# Patient Record
Sex: Male | Born: 1965 | Race: White | Hispanic: No | Marital: Married | Smoking: Former smoker
Health system: Southern US, Community
[De-identification: ages and names within clinical notes are randomized; demographics above are authoritative.]

## PROBLEM LIST (undated history)

## (undated) DIAGNOSIS — C4491 Basal cell carcinoma of skin, unspecified: Secondary | ICD-10-CM

## (undated) DIAGNOSIS — J309 Allergic rhinitis, unspecified: Secondary | ICD-10-CM

## (undated) DIAGNOSIS — S022XXA Fracture of nasal bones, initial encounter for closed fracture: Secondary | ICD-10-CM

## (undated) HISTORY — DX: Allergic rhinitis, unspecified: J30.9

## (undated) HISTORY — DX: Fracture of nasal bones, initial encounter for closed fracture: S02.2XXA

## (undated) HISTORY — PX: KNEE SURGERY: SHX244

## (undated) HISTORY — DX: Basal cell carcinoma of skin, unspecified: C44.91

## (undated) HISTORY — PX: VASECTOMY: SHX75

---

## 2003-07-12 ENCOUNTER — Encounter: Admission: RE | Admit: 2003-07-12 | Discharge: 2003-07-12 | Payer: Self-pay | Admitting: Internal Medicine

## 2003-07-12 ENCOUNTER — Encounter: Payer: Self-pay | Admitting: Internal Medicine

## 2005-09-08 ENCOUNTER — Ambulatory Visit: Payer: Self-pay | Admitting: Internal Medicine

## 2005-10-14 ENCOUNTER — Ambulatory Visit: Payer: Self-pay | Admitting: Internal Medicine

## 2006-02-03 ENCOUNTER — Ambulatory Visit: Payer: Self-pay | Admitting: Internal Medicine

## 2006-03-22 ENCOUNTER — Ambulatory Visit: Payer: Self-pay | Admitting: Internal Medicine

## 2006-07-07 ENCOUNTER — Ambulatory Visit: Payer: Self-pay | Admitting: Internal Medicine

## 2007-02-15 DIAGNOSIS — E8881 Metabolic syndrome: Secondary | ICD-10-CM | POA: Insufficient documentation

## 2007-02-17 ENCOUNTER — Ambulatory Visit: Payer: Self-pay | Admitting: Internal Medicine

## 2007-02-17 LAB — CONVERTED CEMR LAB
ALT: 44 units/L — ABNORMAL HIGH (ref 0–40)
AST: 28 units/L (ref 0–37)
BUN: 14 mg/dL (ref 6–23)
Cholesterol: 214 mg/dL (ref 0–200)
Creatinine, Ser: 0.9 mg/dL (ref 0.4–1.5)
Creatinine,U: 180.6 mg/dL
Direct LDL: 65.9 mg/dL
HDL: 24 mg/dL — ABNORMAL LOW (ref >39.0)
Hgb A1c MFr Bld: 4.9 % (ref 4.6–6.0)
Microalb Creat Ratio: 2.2 mg/g (ref 0.0–30.0)
Microalb, Ur: 0.4 mg/dL (ref 0.0–1.9)
Potassium: 4.1 meq/L (ref 3.5–5.1)
Total CHOL/HDL Ratio: 8.9
Triglycerides: 611 mg/dL (ref 0–149)
VLDL: 122 mg/dL — ABNORMAL HIGH (ref 0–40)

## 2007-05-26 ENCOUNTER — Ambulatory Visit: Payer: Self-pay | Admitting: Internal Medicine

## 2007-05-30 LAB — CONVERTED CEMR LAB
ALT: 22 units/L (ref 0–53)
AST: 20 units/L (ref 0–37)
BUN: 16 mg/dL (ref 6–23)
Cholesterol: 192 mg/dL (ref 0–200)
Creatinine, Ser: 1.1 mg/dL (ref 0.4–1.5)
Creatinine,U: 174.3 mg/dL
Direct LDL: 125.9 mg/dL
HDL: 27.3 mg/dL — ABNORMAL LOW (ref >39.0)
Hgb A1c MFr Bld: 5.1 % (ref 4.6–6.0)
Microalb Creat Ratio: 3.4 mg/g (ref 0.0–30.0)
Microalb, Ur: 0.6 mg/dL (ref 0.0–1.9)
Potassium: 4.4 meq/L (ref 3.5–5.1)
Total CHOL/HDL Ratio: 7
Triglycerides: 211 mg/dL (ref 0–149)
VLDL: 42 mg/dL — ABNORMAL HIGH (ref 0–40)

## 2007-05-31 ENCOUNTER — Encounter (INDEPENDENT_AMBULATORY_CARE_PROVIDER_SITE_OTHER): Payer: Self-pay | Admitting: *Deleted

## 2007-07-18 ENCOUNTER — Ambulatory Visit: Payer: Self-pay | Admitting: Internal Medicine

## 2007-07-18 LAB — CONVERTED CEMR LAB
Cholesterol, target level: 200 mg/dL
HDL goal, serum: 40 mg/dL
LDL Goal: 160 mg/dL

## 2008-01-17 ENCOUNTER — Ambulatory Visit: Payer: Self-pay | Admitting: Internal Medicine

## 2008-01-19 ENCOUNTER — Encounter (INDEPENDENT_AMBULATORY_CARE_PROVIDER_SITE_OTHER): Payer: Self-pay | Admitting: *Deleted

## 2008-01-24 ENCOUNTER — Encounter: Payer: Self-pay | Admitting: Internal Medicine

## 2008-01-30 LAB — CONVERTED CEMR LAB
Cholesterol: 175 mg/dL (ref 0–200)
HDL: 30 mg/dL — ABNORMAL LOW (ref >39.0)
LDL Cholesterol: 111 mg/dL — ABNORMAL HIGH (ref 0–99)
Total CHOL/HDL Ratio: 5.8
Triglycerides: 171 mg/dL — ABNORMAL HIGH (ref 0–149)
VLDL: 34 mg/dL (ref 0–40)

## 2008-02-02 ENCOUNTER — Ambulatory Visit: Payer: Self-pay | Admitting: Internal Medicine

## 2008-02-02 DIAGNOSIS — E785 Hyperlipidemia, unspecified: Secondary | ICD-10-CM | POA: Insufficient documentation

## 2008-03-16 ENCOUNTER — Emergency Department (HOSPITAL_BASED_OUTPATIENT_CLINIC_OR_DEPARTMENT_OTHER): Admission: EM | Admit: 2008-03-16 | Discharge: 2008-03-16 | Payer: Self-pay | Admitting: Emergency Medicine

## 2008-05-04 ENCOUNTER — Ambulatory Visit: Payer: Self-pay | Admitting: Internal Medicine

## 2008-05-08 ENCOUNTER — Encounter: Payer: Self-pay | Admitting: Internal Medicine

## 2008-05-21 ENCOUNTER — Ambulatory Visit: Payer: Self-pay | Admitting: Internal Medicine

## 2008-10-19 ENCOUNTER — Ambulatory Visit: Payer: Self-pay | Admitting: Family Medicine

## 2008-10-26 ENCOUNTER — Ambulatory Visit: Payer: Self-pay | Admitting: Family Medicine

## 2008-10-26 ENCOUNTER — Telehealth (INDEPENDENT_AMBULATORY_CARE_PROVIDER_SITE_OTHER): Payer: Self-pay | Admitting: *Deleted

## 2008-11-14 ENCOUNTER — Encounter: Payer: Self-pay | Admitting: Internal Medicine

## 2009-04-05 ENCOUNTER — Telehealth (INDEPENDENT_AMBULATORY_CARE_PROVIDER_SITE_OTHER): Payer: Self-pay | Admitting: *Deleted

## 2010-01-13 ENCOUNTER — Encounter (INDEPENDENT_AMBULATORY_CARE_PROVIDER_SITE_OTHER): Payer: Self-pay | Admitting: *Deleted

## 2010-02-17 ENCOUNTER — Telehealth (INDEPENDENT_AMBULATORY_CARE_PROVIDER_SITE_OTHER): Payer: Self-pay | Admitting: *Deleted

## 2010-02-21 ENCOUNTER — Ambulatory Visit: Payer: Self-pay | Admitting: Internal Medicine

## 2010-02-21 DIAGNOSIS — Z85828 Personal history of other malignant neoplasm of skin: Secondary | ICD-10-CM | POA: Insufficient documentation

## 2010-03-02 LAB — CONVERTED CEMR LAB
ALT: 45 units/L (ref 0–53)
AST: 27 units/L (ref 0–37)
Albumin: 4.5 g/dL (ref 3.5–5.2)
Alkaline Phosphatase: 65 units/L (ref 39–117)
BUN: 18 mg/dL (ref 6–23)
Bilirubin, Direct: 0.1 mg/dL (ref 0.0–0.3)
Cholesterol: 231 mg/dL — ABNORMAL HIGH (ref 0–200)
Creatinine, Ser: 0.9 mg/dL (ref 0.4–1.5)
HDL: 34.4 mg/dL — ABNORMAL LOW (ref >39.00)
Hgb A1c MFr Bld: 5.2 % (ref 4.6–6.5)
Potassium: 4.6 meq/L (ref 3.5–5.1)
Total Bilirubin: 0.4 mg/dL (ref 0.3–1.2)
Total CHOL/HDL Ratio: 7
Total Protein: 7.5 g/dL (ref 6.0–8.3)
Triglycerides: 459 mg/dL — ABNORMAL HIGH (ref 0.0–149.0)
VLDL: 91.8 mg/dL — ABNORMAL HIGH (ref 0.0–40.0)

## 2010-10-28 NOTE — Letter (Signed)
Summary: Primary Care Appointment Letter  [facility]  [address] [place], [place] [address]   Phone: [phone]  Fax: [phone]    01/13/2010 MRN: [id number]  JONATHON TAN [address] [place], [place]  [address]  Dear Mr. QUEZADA,   Your Primary Care Physician Marga Melnick MD has indicated that:    ___X____it is time to schedule an appointment.  Please call our office @ [phone] to schedule an office visit with Dr. Alwyn Ren.    Thank you,    [facility] Scheduler

## 2010-10-28 NOTE — Assessment & Plan Note (Signed)
Summary: rto & lab/cbs   Vital Signs:  Patient profile:   45 year old male Weight:      297.6 pounds Temp:     98.4 degrees F oral Pulse rate:   72 / minute Resp:     16 per minute BP sitting:   128 / 80  (left arm) Cuff size:   large  Vitals Entered By: Shonna Chock (Feb 21, 2010 9:29 AM) CC: Follow-up visit: Fasting if labs needed, not seen in a while and would like to follow-up, Lipid Management Comments REVIEWED MED LIST, PATIENT AGREED DOSE AND INSTRUCTION CORRECT    CC:  Follow-up visit: Fasting if labs needed, not seen in a while and would like to follow-up, and Lipid Management.  History of Present Illness: He never took Crestor; he ran out of Trilipix in 08/2009. NMR reviewed & risks discussed. No specific diet; no CVE on regular basis. Role of High Fructose Corn Syrup raising TG, causing central obesity   & being pre Diabetic risk discussed.Sister has IDDM.  Lipid Management History:      Positive NCEP/ATP III risk factors include HDL cholesterol less than 40.  Negative NCEP/ATP III risk factors include male age less than 72 years old, non-diabetic, no family history for ischemic heart disease, non-tobacco-user status, non-hypertensive, no ASHD (atherosclerotic heart disease), no prior stroke/TIA, no peripheral vascular disease, and no history of aortic aneurysm.     Allergies (verified): No Known Drug Allergies  Past History:  Past Medical History: HYPERLIPIDEMIA (ICD-272.2):NMR 2009: LDL 141(2850/2194), HDL 43, TG 287. [place] LDL goal = < 160. METABOLIC SYNDROME X (ICD-277.7)  Skin cancer, hx of, Basal Cell, Squamous Cell  Review of Systems CV:  Complains of shortness of breath with exertion; denies chest pain or discomfort, leg cramps with exertion, swelling of feet, and swelling of hands. Derm:  Denies poor wound healing. Neuro:  Denies numbness and tingling. Endo:  Denies excessive hunger, excessive thirst, and excessive urination.  Physical  Exam  General:  well-nourished; alert,appropriate and cooperative throughout examination;overweight-appearing.   Lungs:  Normal respiratory effort, chest expands symmetrically. Lungs are clear to auscultation, no crackles or wheezes. Heart:  Normal rate and regular rhythm. S1 and S2 normal without gallop, murmur, click, rub . S4 Abdomen:  Slightly protuberant Pulses:  R and L carotid,radial,dorsalis pedis and posterior tibial pulses are full and equal bilaterally Extremities:  No clubbing, cyanosis, edema Psych:  memory intact for recent and remote, normally interactive, and good eye contact.  He seems motivated to address risks, but he has been non adherent to diet & med recommendations to date.   Impression & Recommendations:  Problem # 1:  HYPERLIPIDEMIA (ICD-272.4)  The following medications were removed from the medication list:    Trilipix 135 Mg Cpdr (Choline fenofibrate) .Marland Kitchen... 1 once daily**office visit and labs due now**  Problem # 2:  METABOLIC SYNDROME X (ICD-277.7)  Orders: TLB-Lipid Panel (80061-LIPID) TLB-Hepatic/Liver Function Pnl (80076-HEPATIC) TLB-Creatinine, Blood (82565-CREA) TLB-BUN (Urea Nitrogen) (84520-BUN) TLB-Potassium (K+) (84132-K) TLB-A1C / Hgb A1C (Glycohemoglobin) (83036-A1C)  Problem # 3:  DIABETES MELLITUS, FAMILY HX (ICD-V18.0)  Complete Medication List: 1)  Zyrtec Allergy 10 Mg Tabs (Cetirizine hcl) .Marland Kitchen.. 1 by mouth once daily as needed 2)  Fish Oil 1200 Mg Caps (Omega-3 fatty acids) .Marland Kitchen.. 1 by mouth two times a day 3)  Omeprazole 40 Mg Cpdr (Omeprazole) .Marland Kitchen.. 1 tab by mouth daily - due office visit for additional refills 4)  Vitamin C 1000 Mg Tabs (Ascorbic acid) .Marland KitchenMarland KitchenMarland Kitchen  1 by mouth once daily  Lipid Assessment/Plan:      Based on NCEP/ATP III, the patient's risk factor category is "0-1 risk factors".  The patient's lipid goals are as follows: Total cholesterol goal is 200; LDL cholesterol goal is 160; HDL cholesterol goal is 40; Triglyceride goal is  150.  His LDL cholesterol goal has been met.    Patient Instructions: 1)  Consume < 40 grams of High Fructose Corn Syrup "sugar"/ day as discussed.

## 2010-10-28 NOTE — Progress Notes (Signed)
Summary: pt scheduled appt [id number]  Phone Note Outgoing Call Call back at [facility] Phone [phone] Call back at Work Phone [phone]   Summary of Call: DUE OFFICE VISIT .Marland KitchenMarland KitchenMarland KitchenShary Decamp  Feb 17, 2010 9:58 AM   Follow-up for Phone Call        [email or web address] Barban  Feb 17, 2010 9:59 AM  [email or web address] Barban  Feb 18, 2010 11:15 AM   patient returned call appt scheduled [id number] Follow-up by: Okey Regal [place],  Feb 19, 2010 2:01 PM

## 2011-02-10 NOTE — Assessment & Plan Note (Signed)
[facility]                        [facility] [facility] OFFICE NOTE   NAME:Robert Rivera, Robert Rivera                 MRN:          [id number]  DATE:02/17/2007                            DOB:          January 03, 1966    Hayden Kihara was seen Feb 15, 2007 for followup of his metabolic  syndrome.  He was last seen March 22, 2006 and was placed on TriCor 145  mg.  He ran out in March and basically never returned to have it filled.  He does try to restrict white carbs.  He is not engaged in any exercise.   PAST HISTORY:  Vasectomy.   There is a history of stroke in his grandparents.  His father had six-  vessel bypass in his 43s.  His sister is on an insulin pump.  Maternal  grandmother had cancer of the lung and maternal grandfather had strokes  and heart attack.  His grandfather had cancer of prostate and paternal  grandmother had cancer of unknown primary.   He quit smoking in 2004.  He drinks minimally.   ALLERGIES:  He has no known drug allergies.   He has no cardiopulmonary symptoms.   He denies polyuria, polydipsia, polyphagia, paresthesias or non-healing  skin lesions.   He has experienced loose stool 2 hours after eating on a frequent basis.   Presently, he is on no medications.   PHYSICAL EXAMINATION:  Weight is up approximately 7 pounds to 285, pulse  is 60, respiratory rate 15 and blood pressure 128/88.  Fundal exam does reveal some arteriolar narrowing.  Thyroid is normal to palpation.  He has no lymphadenopathy.  CHEST:  Clear.  He has an intermittent grade 1/2 systolic murmur.  He has no organomegaly or masses.  The dorsalis pedis pulses are slightly decreased, but there are no  ischemic changes.  He has chronic fungal changes of the right great toenail.   I discussed the pathophysiology of the metabolic syndrome/pre-diabetes  and provided him with written information.Based on his May 2007 NMR, his  cardiovascular risk long-term is 15% to  20%.  His A1c was within normal  limits at that time at 4.8.   I have also given Thayer Ohm a goal sheet and the form letters on metabolic  syndrome and A1c.   Fasting lipids, A1c, BUN, creatinine and potassium will be checked  today.   He will be advised of medication therapy based on these results.  I have  recommended he review [employer] or [place] or [employer]  Diet to find a low-carb program he can employ.     Titus Dubin. Alwyn Ren, MD,FACP,FCCP  Electronically Signed    [facility]  DD: 02/17/2007  DT: 02/17/2007  Job #: [id number]

## 2011-06-25 LAB — URINE MICROSCOPIC-ADD ON

## 2011-06-25 LAB — URINALYSIS, ROUTINE W REFLEX MICROSCOPIC
Bilirubin Urine: NEGATIVE
Nitrite: NEGATIVE
Specific Gravity, Urine: 1.033 — ABNORMAL HIGH
Urobilinogen, UA: 0.2
pH: 5

## 2014-06-02 ENCOUNTER — Encounter: Payer: Self-pay | Admitting: *Deleted

## 2015-06-20 ENCOUNTER — Other Ambulatory Visit: Payer: Self-pay | Admitting: Family Medicine

## 2015-06-20 ENCOUNTER — Ambulatory Visit
Admission: RE | Admit: 2015-06-20 | Discharge: 2015-06-20 | Disposition: A | Discharge status: Home / Self Care | Payer: BLUE CROSS/BLUE SHIELD | Source: Ambulatory Visit | Attending: Family Medicine | Admitting: Family Medicine

## 2015-06-20 DIAGNOSIS — R05 Cough: Secondary | ICD-10-CM

## 2015-06-20 DIAGNOSIS — R053 Chronic cough: Secondary | ICD-10-CM

## 2020-05-26 ENCOUNTER — Emergency Department (HOSPITAL_BASED_OUTPATIENT_CLINIC_OR_DEPARTMENT_OTHER): Payer: 59

## 2020-05-26 ENCOUNTER — Emergency Department (HOSPITAL_BASED_OUTPATIENT_CLINIC_OR_DEPARTMENT_OTHER)
Admission: EM | Admit: 2020-05-26 | Discharge: 2020-05-26 | Disposition: A | Discharge status: Home / Self Care | Payer: 59 | Attending: Emergency Medicine | Admitting: Emergency Medicine

## 2020-05-26 ENCOUNTER — Encounter (HOSPITAL_BASED_OUTPATIENT_CLINIC_OR_DEPARTMENT_OTHER): Payer: Self-pay | Admitting: *Deleted

## 2020-05-26 ENCOUNTER — Other Ambulatory Visit: Payer: Self-pay

## 2020-05-26 DIAGNOSIS — M79601 Pain in right arm: Secondary | ICD-10-CM | POA: Insufficient documentation

## 2020-05-26 DIAGNOSIS — M791 Myalgia, unspecified site: Secondary | ICD-10-CM

## 2020-05-26 DIAGNOSIS — R52 Pain, unspecified: Secondary | ICD-10-CM

## 2020-05-26 DIAGNOSIS — Z87891 Personal history of nicotine dependence: Secondary | ICD-10-CM | POA: Insufficient documentation

## 2020-05-26 DIAGNOSIS — Z79899 Other long term (current) drug therapy: Secondary | ICD-10-CM | POA: Insufficient documentation

## 2020-05-26 LAB — CBC WITH DIFFERENTIAL/PLATELET
Abs Immature Granulocytes: 0.08 10*3/uL — ABNORMAL HIGH (ref 0.00–0.07)
Basophils Absolute: 0.1 10*3/uL (ref 0.0–0.1)
Basophils Relative: 1 %
Eosinophils Absolute: 0.4 10*3/uL (ref 0.0–0.5)
Eosinophils Relative: 4 %
HCT: 46.2 % (ref 39.0–52.0)
Hemoglobin: 15 g/dL (ref 13.0–17.0)
Immature Granulocytes: 1 %
Lymphocytes Relative: 37 %
Lymphs Abs: 3.4 10*3/uL (ref 0.7–4.0)
MCH: 28.5 pg (ref 26.0–34.0)
MCHC: 32.5 g/dL (ref 30.0–36.0)
MCV: 87.8 fL (ref 80.0–100.0)
Monocytes Absolute: 0.8 10*3/uL (ref 0.1–1.0)
Monocytes Relative: 8 %
Neutro Abs: 4.4 10*3/uL (ref 1.7–7.7)
Neutrophils Relative %: 49 %
Platelets: 260 10*3/uL (ref 150–400)
RBC: 5.26 MIL/uL (ref 4.22–5.81)
RDW: 13.2 % (ref 11.5–15.5)
WBC: 9.1 10*3/uL (ref 4.0–10.5)
nRBC: 0 % (ref 0.0–0.2)

## 2020-05-26 LAB — BASIC METABOLIC PANEL
Anion gap: 9 (ref 5–15)
BUN: 16 mg/dL (ref 6–20)
CO2: 23 mmol/L (ref 22–32)
Calcium: 9.1 mg/dL (ref 8.9–10.3)
Chloride: 106 mmol/L (ref 98–111)
Creatinine, Ser: 0.9 mg/dL (ref 0.61–1.24)
GFR calc Af Amer: >60 mL/min (ref >60)
GFR calc non Af Amer: >60 mL/min (ref >60)
Glucose, Bld: 94 mg/dL (ref 70–99)
Potassium: 4.2 mmol/L (ref 3.5–5.1)
Sodium: 138 mmol/L (ref 135–145)

## 2020-05-26 LAB — TROPONIN I (HIGH SENSITIVITY): Troponin I (High Sensitivity): 3 ng/L (ref <18)

## 2020-05-26 MED ORDER — LIDOCAINE 5 % EX PTCH
2.0000 | MEDICATED_PATCH | CUTANEOUS | Status: DC
  Administered 2020-05-26: 2 via TRANSDERMAL
  Filled 2020-05-26: qty 2

## 2020-05-26 MED ORDER — METHOCARBAMOL 500 MG PO TABS: 500.0000 mg | ORAL_TABLET | Freq: Two times a day (BID) | ORAL | 0 refills | Status: AC

## 2020-05-26 MED ORDER — KETOROLAC TROMETHAMINE 30 MG/ML IJ SOLN
15.0000 mg | Freq: Once | INTRAMUSCULAR | Status: AC
  Administered 2020-05-26: 15 mg via INTRAVENOUS
  Filled 2020-05-26: qty 1

## 2020-05-26 MED ORDER — METHOCARBAMOL 500 MG PO TABS
1000.0000 mg | ORAL_TABLET | ORAL | Status: AC
  Administered 2020-05-26: 1000 mg via ORAL
  Filled 2020-05-26: qty 2

## 2020-05-26 MED ORDER — LIDOCAINE 5 % EX PTCH: 1.0000 | MEDICATED_PATCH | CUTANEOUS | 0 refills | Status: AC

## 2020-05-26 MED ORDER — DICLOFENAC SODIUM ER 100 MG PO TB24: 100.0000 mg | ORAL_TABLET | Freq: Every day | ORAL | 0 refills | Status: AC

## 2020-05-26 MED ORDER — ACETAMINOPHEN 500 MG PO TABS
1000.0000 mg | ORAL_TABLET | Freq: Once | ORAL | Status: AC
  Administered 2020-05-26: 1000 mg via ORAL
  Filled 2020-05-26: qty 2

## 2020-05-26 NOTE — ED Provider Notes (Addendum)
[facility] EMERGENCY DEPARTMENT Provider Note   CSN: [id number] Arrival date & time: 05/26/20  0217     History Chief Complaint  Patient presents with  . right arm pain    Robert Rivera is a 54 y.o. male.  The history is provided by the patient.  Shoulder Pain Location:  Shoulder and arm Shoulder location:  R shoulder Arm location:  R upper arm Injury: no   Pain details:    Quality:  Throbbing   Radiates to:  Does not radiate   Severity:  Severe   Onset quality:  Sudden   Duration:  1 day   Timing:  Constant   Progression:  Unchanged Handedness:  Right-handed Dislocation: no   Foreign body present:  No foreign bodies Prior injury to area:  No Relieved by:  Nothing Worsened by:  Nothing Ineffective treatments: ibuprofen. Associated symptoms: no back pain, no decreased range of motion, no fatigue, no fever, no muscle weakness, no neck pain, no numbness, no stiffness, no swelling and no tingling   Risk factors: no concern for non-accidental trauma   No chest pain, no shortness of breath.  No exertional symptoms.       Past Medical History:  Diagnosis Date  . Allergic rhinitis   . BCC (basal cell carcinoma)   . Broken nose     Patient Active Problem List   Diagnosis Date Noted  . SKIN CANCER, HX OF 02/21/2010  . HYPERLIPIDEMIA 02/02/2008  . METABOLIC SYNDROME X 46/96/2952    Past Surgical History:  Procedure Laterality Date  . KNEE SURGERY    . VASECTOMY         Family History  Problem Relation Age of Onset  . Cancer Mother   . Coronary artery disease Father   . Cancer Paternal Grandfather     Social History   Tobacco Use  . Smoking status: Former Research scientist (life sciences)  . Smokeless tobacco: Never Used  Substance Use Topics  . Alcohol use: Yes    Comment: occasional   . Drug use: No    Home Medications Prior to Admission medications   Medication Sig Start Date End Date Taking? Authorizing Provider  diclofenac (VOLTAREN) 75 MG EC  tablet Take 75 mg by mouth 2 (two) times daily.   Yes [provider]  MELOXICAM PO Take by mouth.   Yes [provider]  Ascorbic Acid (VITAMIN C PO) Take by mouth daily.    [provider]  cetirizine (ZYRTEC) 10 MG tablet Take 10 mg by mouth daily.    [provider]  Choline Fenofibrate (TRILIPIX) 135 MG capsule Take 135 mg by mouth daily.    [provider]  fluticasone (FLONASE) 50 MCG/ACT nasal spray Place into both nostrils daily.    [provider]  Glucosamine 500 MG CAPS Take by mouth.    [provider]  montelukast (SINGULAIR) 10 MG tablet Take 10 mg by mouth at bedtime.    [provider]  niacin (NIASPAN) 500 MG CR tablet Take 500 mg by mouth at bedtime.    [provider]  Omega-3 Fatty Acids (FISH OIL PO) Take by mouth daily.    [provider]    Allergies    Patient has no known allergies.  Review of Systems   Review of Systems  Constitutional: Negative for fatigue and fever.  HENT: Negative for congestion.   Eyes: Negative for visual disturbance.  Respiratory: Negative for shortness of breath.   Cardiovascular: Negative  for chest pain.  Gastrointestinal: Negative for abdominal pain.  Genitourinary: Negative for difficulty urinating.  Musculoskeletal: Negative for back pain, neck pain and stiffness.  Skin: Negative for rash.  Neurological: Negative for dizziness.  Psychiatric/Behavioral: Negative for agitation.  All other systems reviewed and are negative.   Physical Exam Updated Vital Signs BP (!) 145/72 (BP Location: Right Arm)   Pulse 63   Temp 98.2 F (36.8 C) (Oral)   Resp 20   Ht 6\' 2"  (1.88 m)   Wt (!) 167.8 kg   SpO2 97%   BMI 47.51 kg/m   Physical Exam Vitals and nursing note reviewed.  Constitutional:      General: He is not in acute distress.    Appearance: Normal appearance.  HENT:     Head: Normocephalic and atraumatic.     Nose: Nose normal.    Eyes:     Conjunctiva/sclera: Conjunctivae normal.     Pupils: Pupils are equal, round, and reactive to light.  Cardiovascular:     Rate and Rhythm: Normal rate and regular rhythm.     Pulses: Normal pulses.     Heart sounds: Normal heart sounds.  Pulmonary:     Effort: Pulmonary effort is normal.     Breath sounds: Normal breath sounds.  Abdominal:     General: Abdomen is flat. Bowel sounds are normal.     Palpations: Abdomen is soft.     Tenderness: There is no abdominal tenderness. There is no guarding.  Musculoskeletal:        General: No swelling, tenderness, deformity or signs of injury. Normal range of motion.     Right shoulder: Normal.     Right upper arm: Normal.     Right elbow: Normal.     Right forearm: Normal.     Right wrist: Normal.     Right hand: Normal.     Cervical back: Normal, normal range of motion and neck supple.     Thoracic back: Normal.     Comments: Negative neers test of the right shoulder.  No winging of the scapula.  Biceps and triceps tendons arr intact, normal DTRs, no swelling no redness no warmth   Skin:    General: Skin is warm and dry.     Capillary Refill: Capillary refill takes less than 2 seconds.  Neurological:     General: No focal deficit present.     Mental Status: He is alert and oriented to person, place, and time.     Sensory: No sensory deficit.     Motor: No weakness.     Deep Tendon Reflexes: Reflexes normal.  Psychiatric:        Mood and Affect: Mood normal.     ED Results / Procedures / Treatments   Labs (all labs ordered are listed, but only abnormal results are displayed) Results for orders placed or performed during the hospital encounter of 05/26/20  CBC with Differential/Platelet  Result Value Ref Range   WBC 9.1 4.0 - 10.5 K/uL   RBC 5.26 4.22 - 5.81 MIL/uL   Hemoglobin 15.0 13.0 - 17.0 g/dL   HCT 46.2 39 - 52 %   MCV 87.8 80.0 - 100.0 fL   MCH 28.5 26.0 - 34.0 pg   MCHC 32.5 30.0 - 36.0 g/dL   RDW 13.2  11.5 - 15.5 %   Platelets 260 150 - 400 K/uL   nRBC 0.0 0.0 - 0.2 %   Neutrophils Relative % 49 %  Neutro Abs 4.4 1.7 - 7.7 K/uL   Lymphocytes Relative 37 %   Lymphs Abs 3.4 0.7 - 4.0 K/uL   Monocytes Relative 8 %   Monocytes Absolute 0.8 0 - 1 K/uL   Eosinophils Relative 4 %   Eosinophils Absolute 0.4 0 - 0 K/uL   Basophils Relative 1 %   Basophils Absolute 0.1 0 - 0 K/uL   Immature Granulocytes 1 %   Abs Immature Granulocytes 0.08 (H) 0.00 - 0.07 K/uL  Basic metabolic panel  Result Value Ref Range   Sodium 138 135 - 145 mmol/L   Potassium 4.2 3.5 - 5.1 mmol/L   Chloride 106 98 - 111 mmol/L   CO2 23 22 - 32 mmol/L   Glucose, Bld 94 70 - 99 mg/dL   BUN 16 6 - 20 mg/dL   Creatinine, Ser 0.90 0.61 - 1.24 mg/dL   Calcium 9.1 8.9 - 10.3 mg/dL   GFR calc non Af Amer >60 >60 mL/min   GFR calc Af Amer >60 >60 mL/min   Anion gap 9 5 - 15  Troponin I (High Sensitivity)  Result Value Ref Range   Troponin I (High Sensitivity) 3 <18 ng/L   DG Chest 2 View  Result Date: 05/26/2020 CLINICAL DATA:  54 year old male with right shoulder pain. EXAM: CHEST - 2 VIEW; RIGHT SHOULDER - 2+ VIEW COMPARISON:  None. FINDINGS: The lungs are clear. There is no pleural effusion or pneumothorax. The cardiac silhouette is within limits. No acute osseous pathology. There is no acute fracture or dislocation of the right shoulder. The bones are well mineralized. No significant arthritic changes. The soft tissues are unremarkable. IMPRESSION: 1. No acute cardiopulmonary process. 2. No acute/traumatic right shoulder pathology. Electronically Signed   By: Anner Crete M.D.   On: 05/26/2020 03:19   DG Shoulder Right  Result Date: 05/26/2020 CLINICAL DATA:  54 year old male with right shoulder pain. EXAM: CHEST - 2 VIEW; RIGHT SHOULDER - 2+ VIEW COMPARISON:  None. FINDINGS: The lungs are clear. There is no pleural effusion or pneumothorax. The cardiac silhouette is within limits. No acute osseous pathology.  There is no acute fracture or dislocation of the right shoulder. The bones are well mineralized. No significant arthritic changes. The soft tissues are unremarkable. IMPRESSION: 1. No acute cardiopulmonary process. 2. No acute/traumatic right shoulder pathology. Electronically Signed   By: Anner Crete M.D.   On: 05/26/2020 03:19    EKG  EKG Interpretation  Date/Time:  Sunday May 26 2020 02:55:06 EDT Ventricular Rate:  64 PR Interval:    QRS Duration: 112 QT Interval:  418 QTC Calculation: 432 R Axis:   56 Text Interpretation: Sinus rhythm Ventricular premature complex Confirmed by Dory Horn) on 05/26/2020 4:16:19 AM       Radiology DG Chest 2 View  Result Date: 05/26/2020 CLINICAL DATA:  54 year old male with right shoulder pain. EXAM: CHEST - 2 VIEW; RIGHT SHOULDER - 2+ VIEW COMPARISON:  None. FINDINGS: The lungs are clear. There is no pleural effusion or pneumothorax. The cardiac silhouette is within limits. No acute osseous pathology. There is no acute fracture or dislocation of the right shoulder. The bones are well mineralized. No significant arthritic changes. The soft tissues are unremarkable. IMPRESSION: 1. No acute cardiopulmonary process. 2. No acute/traumatic right shoulder pathology. Electronically Signed   By: Anner Crete M.D.   On: 05/26/2020 03:19   DG Shoulder Right  Result Date: 05/26/2020 CLINICAL DATA:  54 year old male with right shoulder pain. EXAM:  CHEST - 2 VIEW; RIGHT SHOULDER - 2+ VIEW COMPARISON:  None. FINDINGS: The lungs are clear. There is no pleural effusion or pneumothorax. The cardiac silhouette is within limits. No acute osseous pathology. There is no acute fracture or dislocation of the right shoulder. The bones are well mineralized. No significant arthritic changes. The soft tissues are unremarkable. IMPRESSION: 1. No acute cardiopulmonary process. 2. No acute/traumatic right shoulder pathology. Electronically Signed   By: Anner Crete M.D.   On: 05/26/2020 03:19    Procedures Procedures (including critical care time)  Medications Ordered in ED Medications  lidocaine (LIDODERM) 5 % 2 patch (2 patches Transdermal Patch Applied 05/26/20 0355)  ketorolac (TORADOL) 30 MG/ML injection 15 mg (15 mg Intravenous Given 05/26/20 0356)  acetaminophen (TYLENOL) tablet 1,000 mg (1,000 mg Oral Given 05/26/20 0357)  methocarbamol (ROBAXIN) tablet 1,000 mg (1,000 mg Oral Given 05/26/20 0357)    ED Course  I have reviewed the triage vital signs and the nursing notes.  Pertinent labs & imaging results that were available during my care of the patient were reviewed by me and considered in my medical decision making (see chart for details).    MSK in nature.  Xrays are negative.  NEers test is negative and this is not the rotator cuff.  5/5 strength on the RUE.  Will refer to sports medicine who may be able to do an injection for pain.  Ruled out for MI with a normal EKG and troponin and shoulder only pain for more than 24 hours.  Will treat with NSAIDs lidoderm and robaxin.  Do not take naproxen or ibuprofen while taking the prescribed medications this was also printed on the discharge papers.    Robert Rivera was evaluated in Emergency Department on 05/26/2020 for the symptoms described in the history of present illness. He was evaluated in the context of the global COVID-19 pandemic, which necessitated consideration that the patient might be at risk for infection with the SARS-CoV-2 virus that causes COVID-19. Institutional protocols and algorithms that pertain to the evaluation of patients at risk for COVID-19 are in a state of rapid change based on information released by regulatory bodies including the CDC and federal and state organizations. These policies and algorithms were followed during the patient's care in the ED.  Final Clinical Impression(s) / ED Diagnoses Return for intractable cough, coughing up blood,fevers  >100.4 unrelieved by medication, shortness of breath, intractable vomiting, chest pain, shortness of breath, weakness,numbness, changes in speech, facial asymmetry,abdominal pain, passing out,Inability to tolerate liquids or food, cough, altered mental status or any concerns. No signs of systemic illness or infection. The patient is nontoxic-appearing on exam and vital signs are within normal limits.   I have reviewed the triage vital signs and the nursing notes. Pertinent labs &imaging results that were available during my care of the patient were reviewed by me and considered in my medical decision making (see chart for details).After history, exam, and medical workup I feel the patient has beenappropriately medically screened and is safe for discharge home. Pertinent diagnoses were discussed with the patient. Patient was given return precautions.   Deondrick Searls, MD 05/26/20 Lyndee Leo, Akeen Ledyard, MD 05/26/20 7616

## 2020-05-26 NOTE — ED Triage Notes (Addendum)
Pt c/o right shoulder pain that now radiates into his right elbow area. Took 3 ibuprofen last  at 2030 last night. Describes as a constant and throbbing type pain. Denies any injury. States hx of injury when younger to right shoulder, but nothing recent. C/o bilateral ankles being swollen as well. No other symptoms. States pain makes him feel nauseated. Denies any chest pain.

## 2020-05-26 NOTE — Discharge Instructions (Addendum)
Do not take ibuprofen or naproxen with these medications

## 2020-09-25 IMAGING — CR DG CHEST 2V
2 series · 2 of 2 positions shown · non-contrast
Comparison: None.

CLINICAL DATA: 54-year-old male with right shoulder pain.

EXAM:
CHEST - 2 VIEW; RIGHT SHOULDER - 2+ VIEW

[w chest pa]
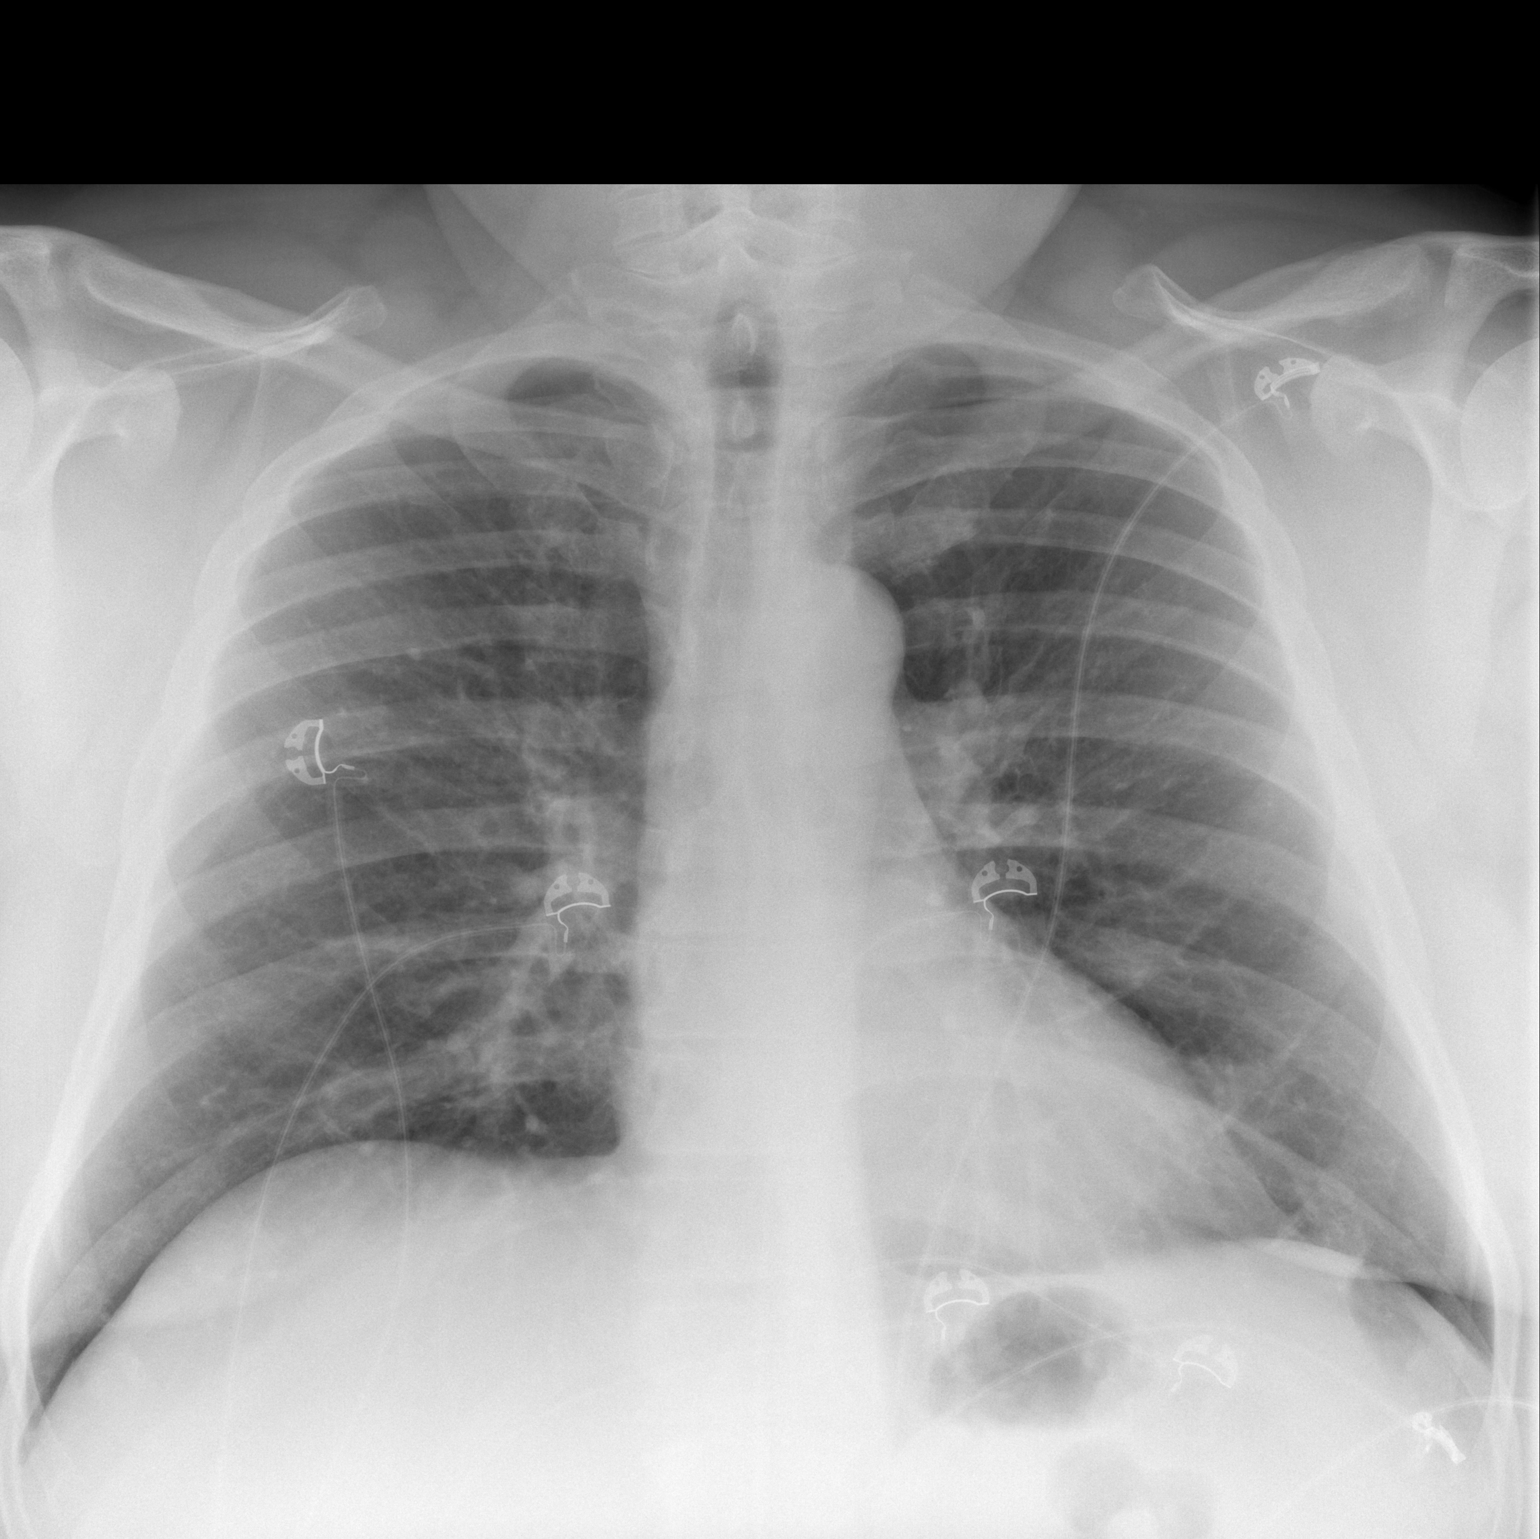

[w chest lat]
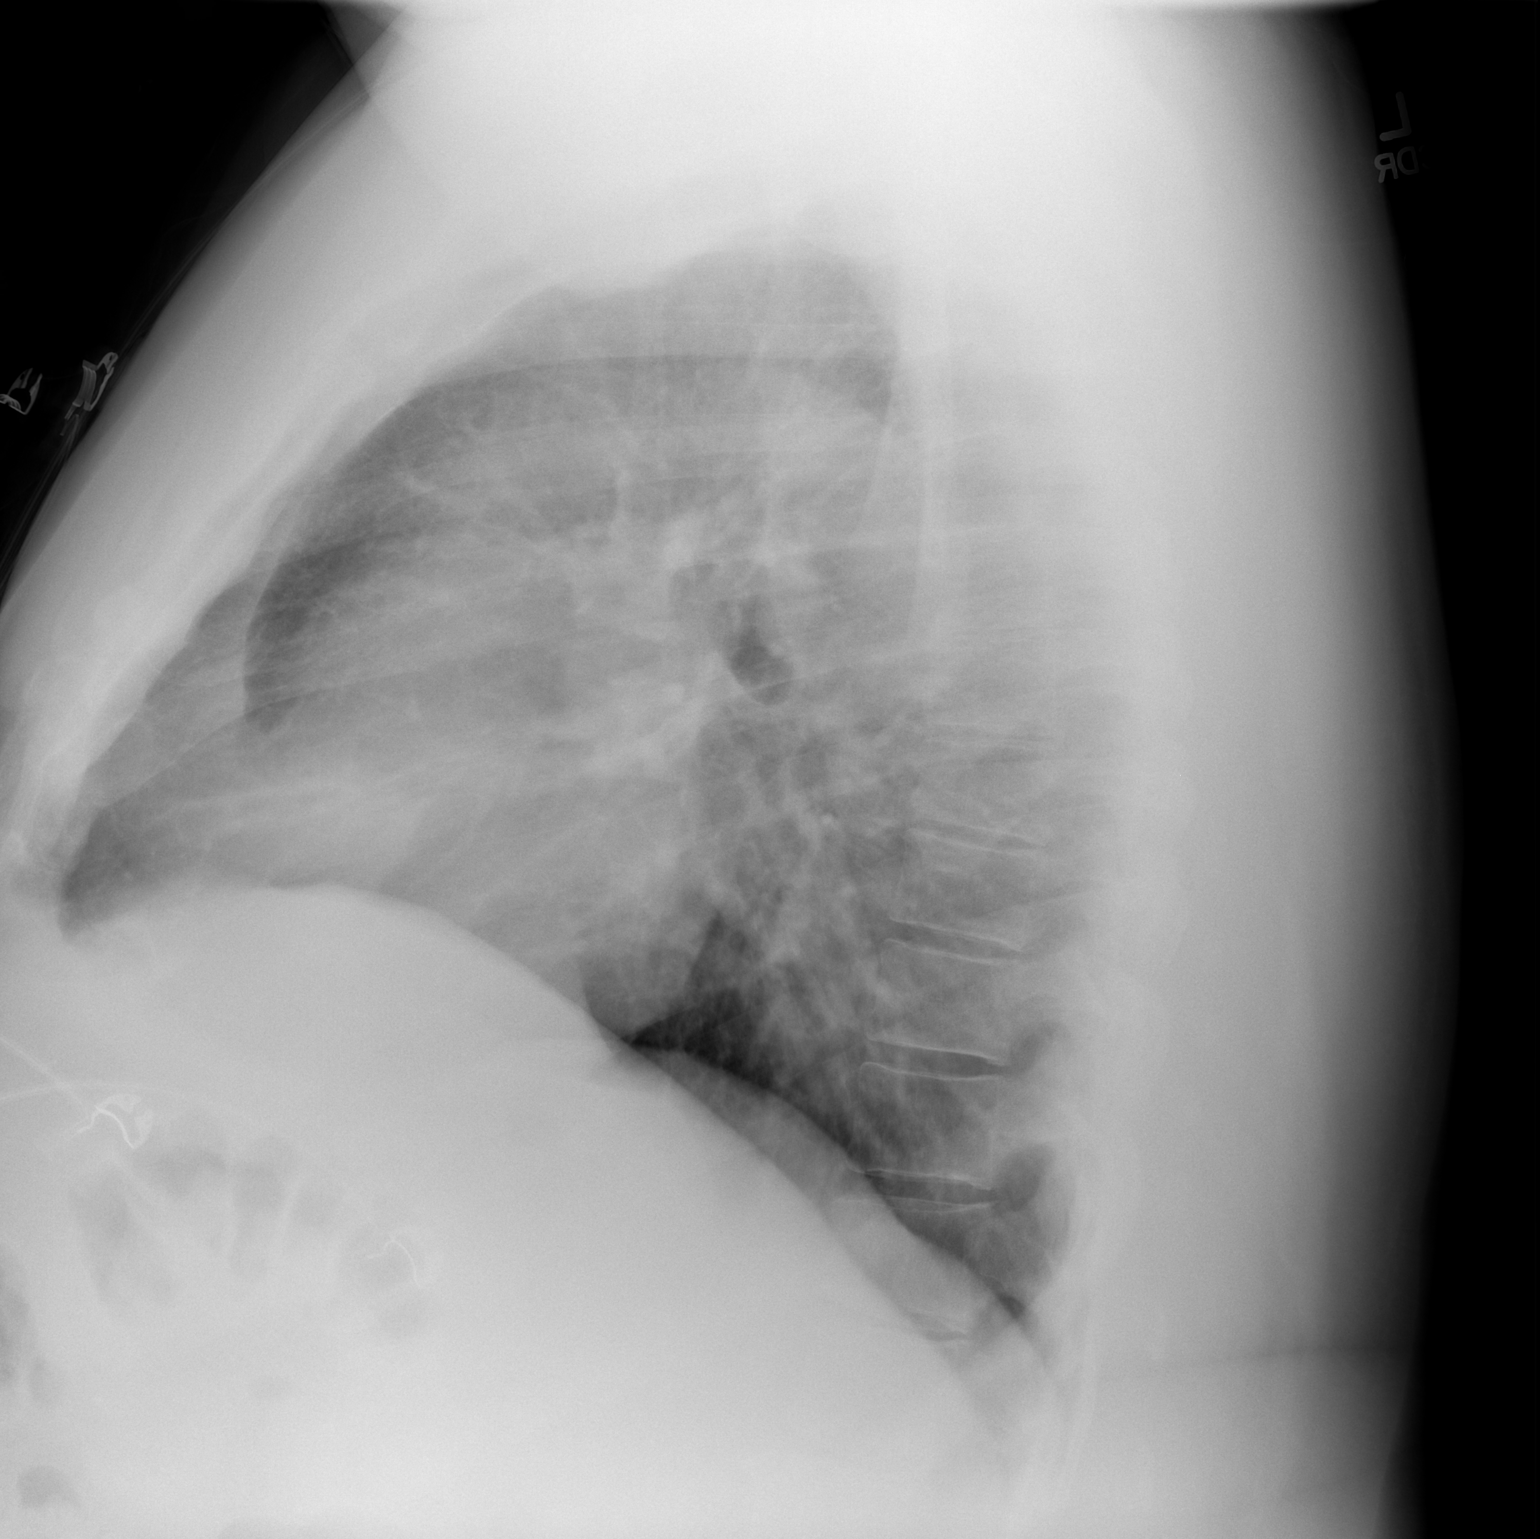

[2 of 2 positions shown; findings below may reference images not displayed]

FINDINGS: The lungs are clear. There is no pleural effusion or pneumothorax.
The cardiac silhouette is within limits. No acute osseous pathology.

There is no acute fracture or dislocation of the right shoulder. The
bones are well mineralized. No significant arthritic changes. The
soft tissues are unremarkable.
IMPRESSION: 1. No acute cardiopulmonary process.
2. No acute/traumatic right shoulder pathology.

## 2020-09-25 IMAGING — CR DG SHOULDER 2+V*R*
3 series · 3 of 3 positions shown · non-contrast
Comparison: None.

CLINICAL DATA: 54-year-old male with right shoulder pain.

EXAM:
CHEST - 2 VIEW; RIGHT SHOULDER - 2+ VIEW

[w shoulder grashey right]
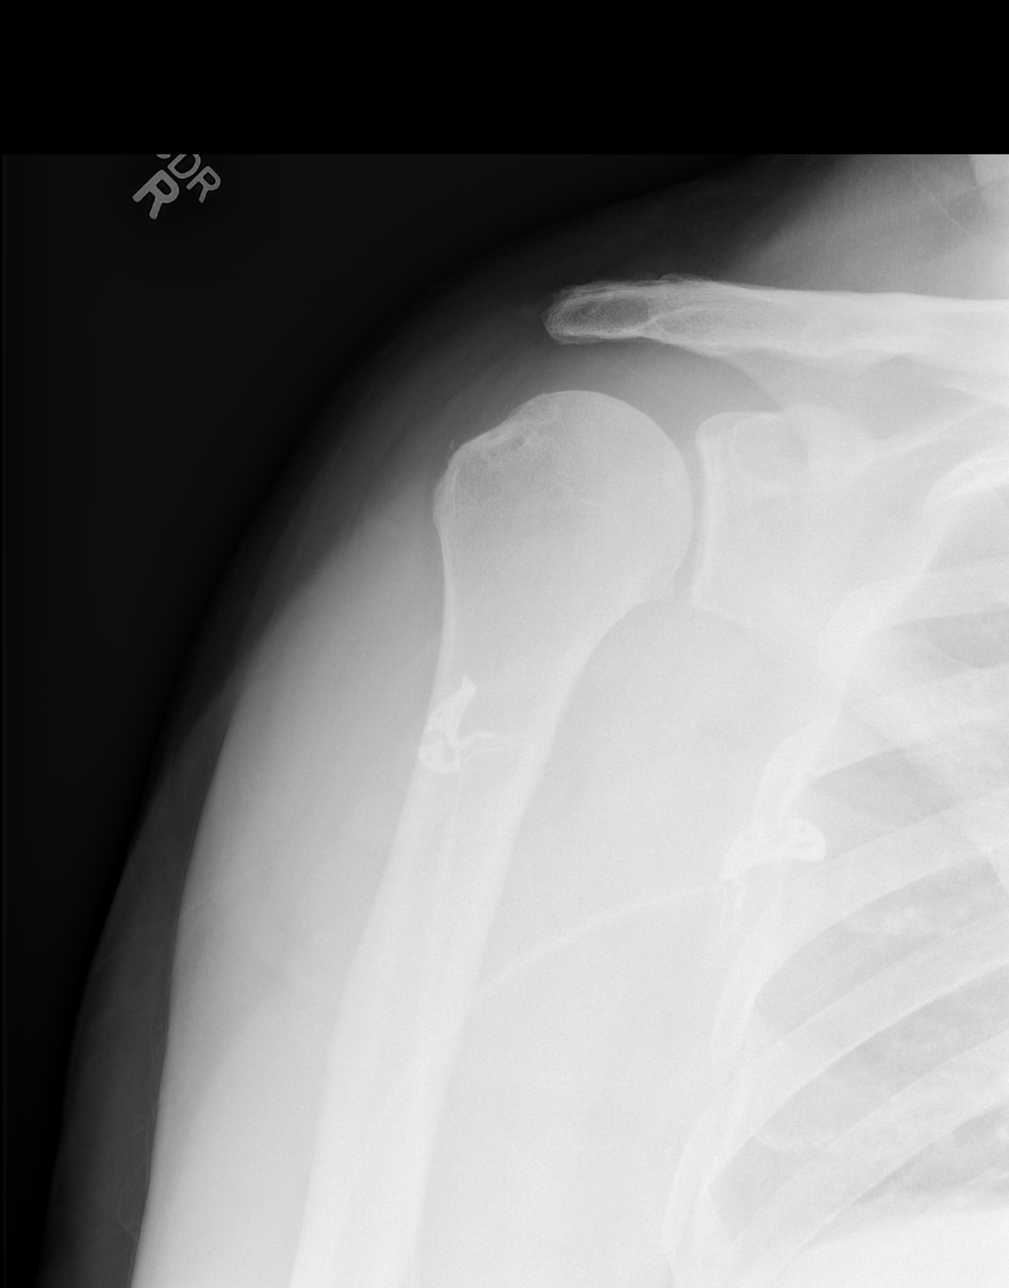

[w shoulder y view right *]
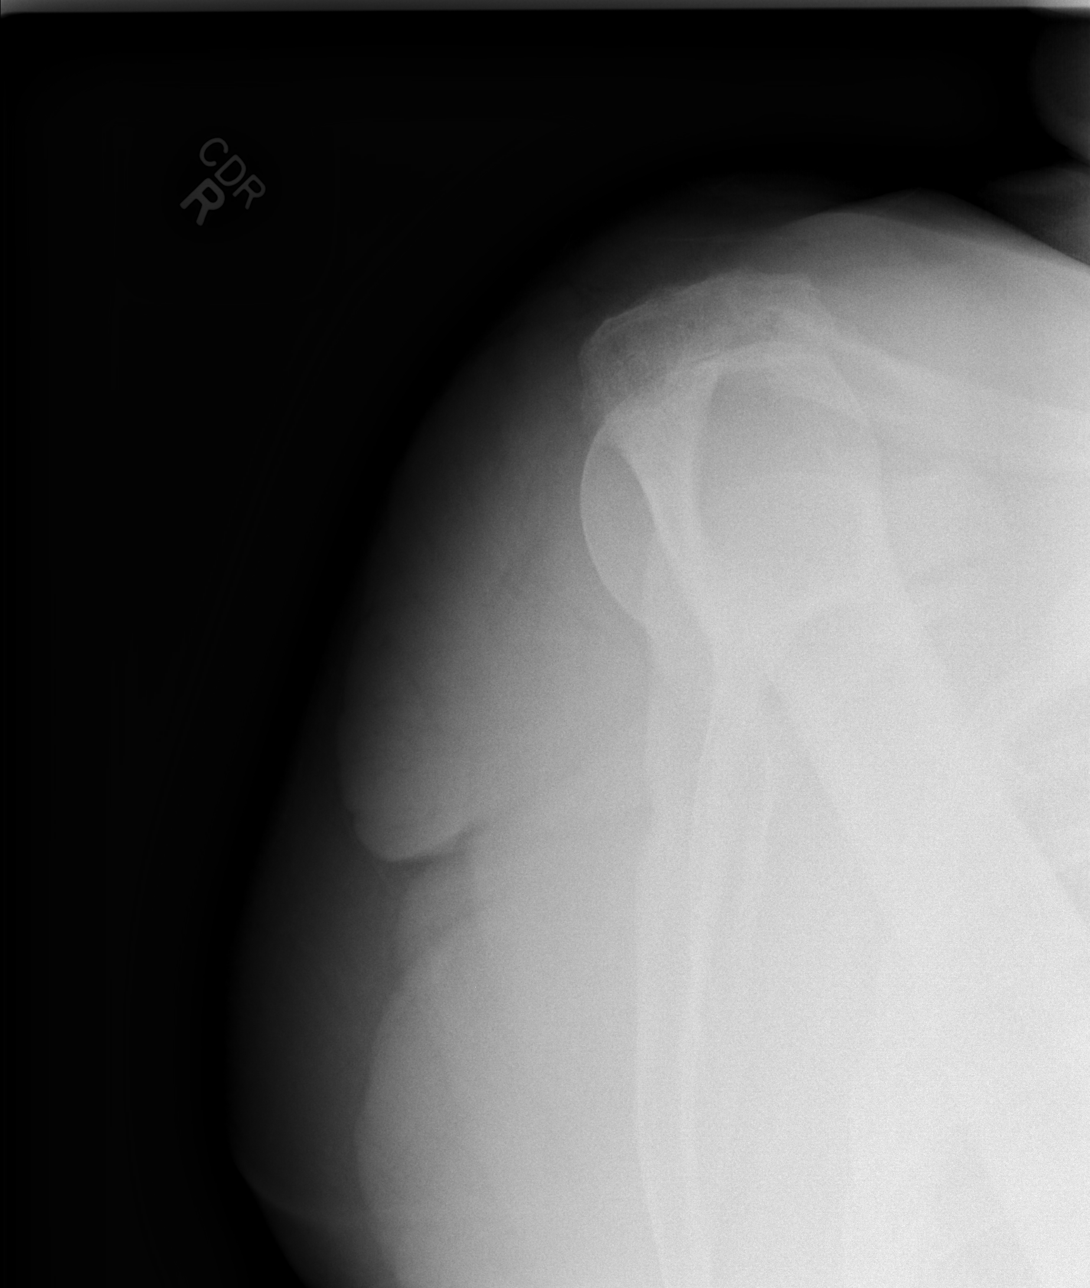

[x shoulder axillary right *]
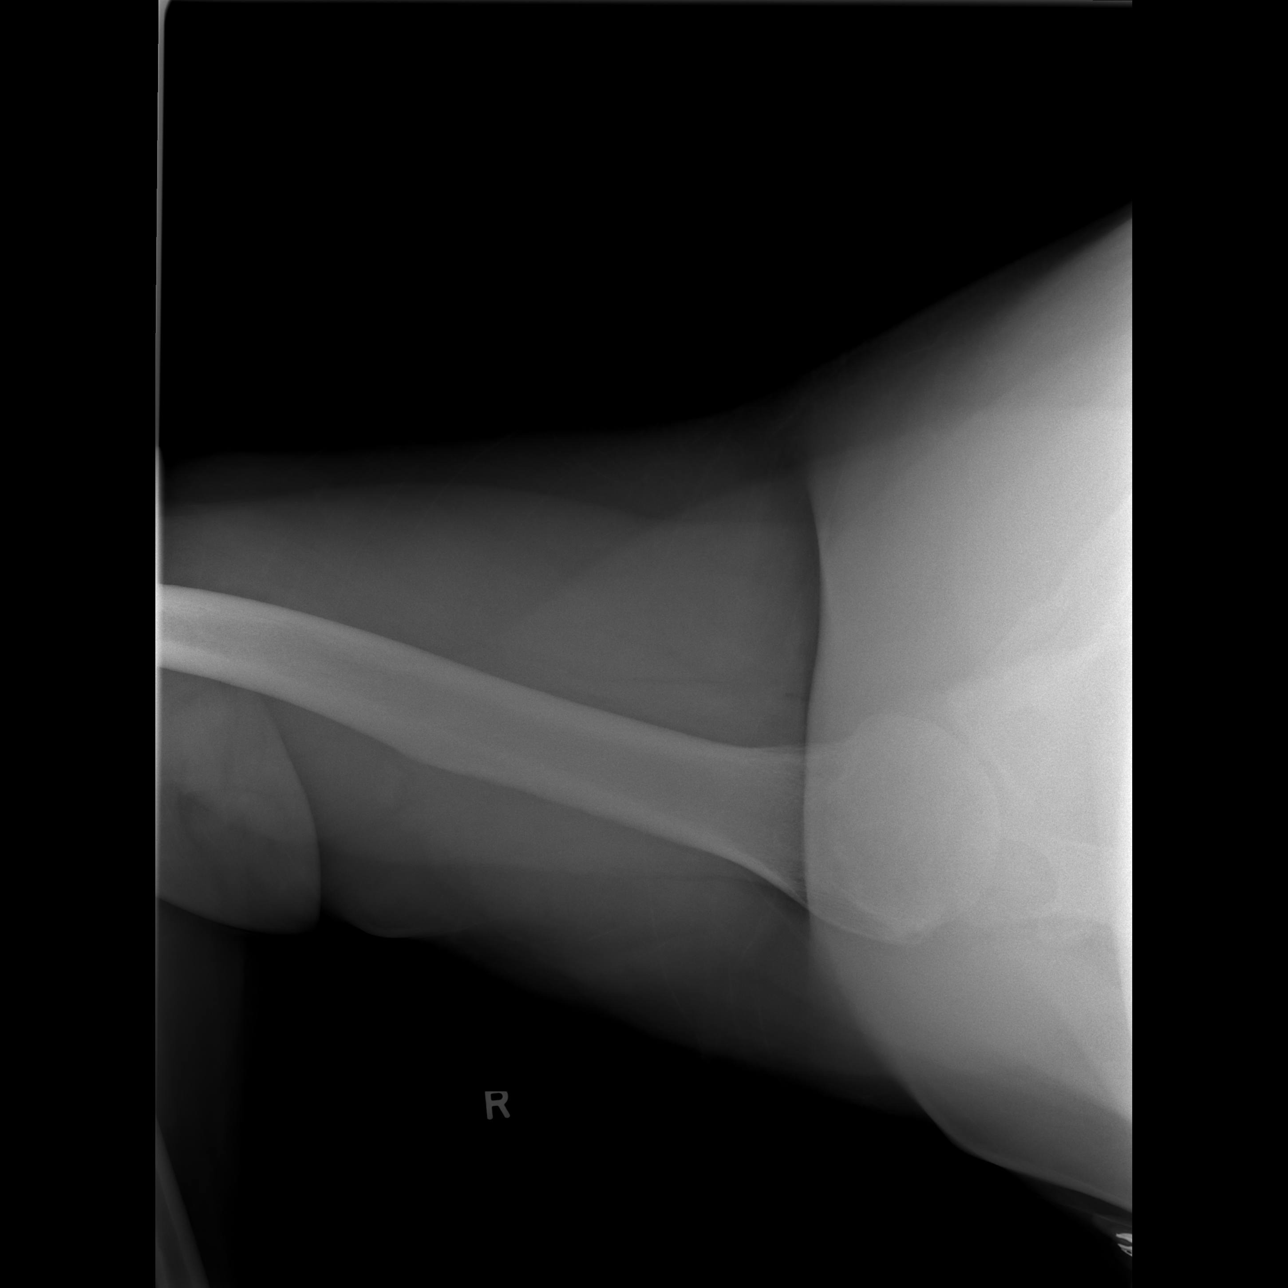

[3 of 3 positions shown; findings below may reference images not displayed]

FINDINGS: The lungs are clear. There is no pleural effusion or pneumothorax.
The cardiac silhouette is within limits. No acute osseous pathology.

There is no acute fracture or dislocation of the right shoulder. The
bones are well mineralized. No significant arthritic changes. The
soft tissues are unremarkable.
IMPRESSION: 1. No acute cardiopulmonary process.
2. No acute/traumatic right shoulder pathology.
